# Patient Record
Sex: Male | Born: 1990 | Hispanic: No | Marital: Single | Smoking: Never smoker
Health system: Southern US, Community
[De-identification: ages and names within clinical notes are randomized; demographics above are authoritative.]

---

## 2012-10-31 ENCOUNTER — Emergency Department (HOSPITAL_COMMUNITY)
Admission: EM | Admit: 2012-10-31 | Discharge: 2012-10-31 | Disposition: A | Discharge status: Home / Self Care | Payer: Self-pay | Attending: Emergency Medicine | Admitting: Emergency Medicine

## 2012-10-31 ENCOUNTER — Encounter (HOSPITAL_COMMUNITY): Payer: Self-pay | Admitting: *Deleted

## 2012-10-31 ENCOUNTER — Emergency Department (HOSPITAL_COMMUNITY): Payer: Self-pay

## 2012-10-31 DIAGNOSIS — R509 Fever, unspecified: Secondary | ICD-10-CM | POA: Insufficient documentation

## 2012-10-31 DIAGNOSIS — J111 Influenza due to unidentified influenza virus with other respiratory manifestations: Secondary | ICD-10-CM | POA: Insufficient documentation

## 2012-10-31 LAB — URINALYSIS, ROUTINE W REFLEX MICROSCOPIC
Leukocytes, UA: NEGATIVE
Nitrite: NEGATIVE
Specific Gravity, Urine: 1.016 (ref 1.005–1.030)
pH: 7 (ref 5.0–8.0)

## 2012-10-31 LAB — BASIC METABOLIC PANEL
BUN: 13 mg/dL (ref 6–23)
Calcium: 9.8 mg/dL (ref 8.4–10.5)
GFR calc non Af Amer: 82 mL/min — ABNORMAL LOW (ref >90)
Glucose, Bld: 105 mg/dL — ABNORMAL HIGH (ref 70–99)
Sodium: 135 mEq/L (ref 135–145)

## 2012-10-31 LAB — CBC WITH DIFFERENTIAL/PLATELET
Basophils Relative: 1 % (ref 0–1)
Eosinophils Absolute: 0.1 10*3/uL (ref 0.0–0.7)
Hemoglobin: 17.2 g/dL — ABNORMAL HIGH (ref 13.0–17.0)
MCH: 31.3 pg (ref 26.0–34.0)
MCHC: 35.6 g/dL (ref 30.0–36.0)
Monocytes Absolute: 1.7 10*3/uL — ABNORMAL HIGH (ref 0.1–1.0)
Neutro Abs: 3.8 10*3/uL (ref 1.7–7.7)
Neutrophils Relative %: 44 % (ref 43–77)
RDW: 12.6 % (ref 11.5–15.5)

## 2012-10-31 LAB — GRAM STAIN: Gram Stain: NONE SEEN

## 2012-10-31 LAB — CSF CELL COUNT WITH DIFFERENTIAL

## 2012-10-31 LAB — RAPID URINE DRUG SCREEN, HOSP PERFORMED: Barbiturates: NOT DETECTED

## 2012-10-31 LAB — PROTEIN AND GLUCOSE, CSF: Total  Protein, CSF: 29 mg/dL (ref 15–45)

## 2012-10-31 MED ORDER — ACETAMINOPHEN 650 MG RE SUPP: 650.0000 mg | Freq: Once | RECTAL | Status: AC

## 2012-10-31 MED ORDER — OSELTAMIVIR PHOSPHATE 75 MG PO CAPS: 75.0000 mg | ORAL_CAPSULE | Freq: Two times a day (BID) | ORAL | Status: DC

## 2012-10-31 MED ORDER — ACETAMINOPHEN 650 MG RE SUPP
RECTAL | Status: AC
  Administered 2012-10-31: 01:00:00
  Filled 2012-10-31: qty 1

## 2012-10-31 MED ORDER — SODIUM CHLORIDE 0.9 % IV BOLUS (SEPSIS)
1000.0000 mL | Freq: Once | INTRAVENOUS | Status: AC
  Administered 2012-10-31: 1000 mL via INTRAVENOUS

## 2012-10-31 MED ORDER — ACETAMINOPHEN 500 MG PO TABS: 1000.0000 mg | ORAL_TABLET | Freq: Once | ORAL | Status: DC

## 2012-10-31 NOTE — ED Notes (Signed)
Father brought pt in via POV  States they were sitting at home on couch and that he said he couldn't breath good,  Pt has not been feeling well and father states that he took motrin and robitussin today, pt is hyperventilating,  Hands are drawn

## 2012-10-31 NOTE — ED Notes (Signed)
Pa informed about pt's vitals

## 2012-10-31 NOTE — ED Provider Notes (Signed)
History     CSN: [id number]  Arrival date & time 10/31/12  0015   First MD Initiated Contact with Patient 10/31/12 0043      Chief Complaint  Patient presents with  . Shortness of Breath   Level V caveat applies due to urgent need to intervene  HPI  History provided by the patient's father. Patient is a 21 year old male with no significant PMH who presents with fever, shortness of breath and altered mental status. Patient was having some cough and fever symptoms for the past 2 days and was not feeling well. He was given Robitussin last night but continued to have some symptoms. Early this morning patient continues to look ill and parents were concerned and brought patient for further evaluation. Patient has not had any recent travel. He has no specific known sick contacts. Patient does not believe he uses any drugs or alcohol.    No past medical history on file.  No past surgical history on file.  History reviewed. No pertinent family history.  History  Substance Use Topics  . Smoking status: Never Smoker   . Smokeless tobacco: Not on file  . Alcohol Use: No      Review of Systems  Unable to perform ROS: Other    Allergies  Review of patient's allergies indicates no known allergies.  Home Medications  No current outpatient prescriptions on file.  BP 133/60  Pulse 115  Temp 102.1 F (38.9 C) (Rectal)  Resp 20  SpO2 100%  Physical Exam  Nursing note and vitals reviewed. Constitutional: He appears well-developed and well-nourished.       Patient with active rigors.  HENT:  Head: Normocephalic and atraumatic.  Eyes: Conjunctivae normal and EOM are normal. Pupils are equal, round, and reactive to light.  Neck: Neck supple.       Pain with difficulty flexing neck  Cardiovascular: Regular rhythm.  Tachycardia present.   No murmur heard. Pulmonary/Chest: Effort normal and breath sounds normal. No respiratory distress. He has no wheezes. He has no rales.   Abdominal: Soft. There is no tenderness. There is no rebound and no guarding.  Musculoskeletal: He exhibits no edema and no tenderness.  Lymphadenopathy:    He has no cervical adenopathy.  Neurological: He is alert.       Follows commands but is not speaking.  Skin: Skin is warm.    ED Course  LUMBAR PUNCTURE Date/Time: 10/31/2012 2:00 AM Performed by: Angus Seller Authorized by: Angus Seller Consent: Verbal consent obtained. Risks and benefits: risks, benefits and alternatives were discussed Consent given by: parent Procedure consent: procedure consent matches procedure scheduled Site marked: the operative site was marked Patient identity confirmed: arm band Time out: Immediately prior to procedure a "time out" was called to verify the correct patient, procedure, equipment, support staff and site/side marked as required. Indications: evaluation for altered mental status and evaluation for infection Anesthesia: local infiltration Local anesthetic: lidocaine 2% with epinephrine Anesthetic total: 2 ml Patient sedated: no Preparation: Patient was prepped and draped in the usual sterile fashion. Lumbar space: L4-L5 interspace Patient's position: right lateral decubitus Needle gauge: 22 Needle type: spinal needle - Quincke tip Needle length: 2.5 in Number of attempts: 1 Opening pressure: 21 cm H2O Closing pressure: 17 cm H2O Fluid appearance: clear Tubes of fluid: 4 Total volume: 6 ml Post-procedure: site cleaned and adhesive bandage applied Patient tolerance: Patient tolerated the procedure well with no immediate complications.     CRITICAL CARE Performed  by: Gohan Collister S   Total critical care time: 40 minutes  Critical care time was exclusive of separately billable procedures and treating other patients.  Critical care was necessary to treat or prevent imminent or life-threatening deterioration.  Critical care was time spent personally by me on the  following activities: development of treatment plan with patient and/or surrogate as well as nursing, discussions with consultants, evaluation of patient's response to treatment, examination of patient, obtaining history from patient or surrogate, ordering and performing treatments and interventions, ordering and review of laboratory studies, ordering and review of radiographic studies, pulse oximetry and re-evaluation of patient's condition.  Multiple re\re evaluations of patient's. Lumbar puncture performed to rule out meningitis. Continued evaluation of laboratory testing. Close monitoring of temperature and vital signs.    Results for orders placed during the hospital encounter of 10/31/12  CBC WITH DIFFERENTIAL      Component Value Range   WBC 8.8  4.0 - 10.5 K/uL   RBC 5.50  4.22 - 5.81 MIL/uL   Hemoglobin 17.2 (*) 13.0 - 17.0 g/dL   HCT 40.9  81.1 - 91.4 %   MCV 87.8  78.0 - 100.0 fL   MCH 31.3  26.0 - 34.0 pg   MCHC 35.6  30.0 - 36.0 g/dL   RDW 78.2  95.6 - 21.3 %   Platelets 169  150 - 400 K/uL   Neutrophils Relative 44  43 - 77 %   Lymphocytes Relative 35  12 - 46 %   Monocytes Relative 19 (*) 3 - 12 %   Eosinophils Relative 1  0 - 5 %   Basophils Relative 1  0 - 1 %   Neutro Abs 3.8  1.7 - 7.7 K/uL   Lymphs Abs 3.1  0.7 - 4.0 K/uL   Monocytes Absolute 1.7 (*) 0.1 - 1.0 K/uL   Eosinophils Absolute 0.1  0.0 - 0.7 K/uL   Basophils Absolute 0.1  0.0 - 0.1 K/uL   WBC Morphology ATYPICAL LYMPHOCYTES    BASIC METABOLIC PANEL      Component Value Range   Sodium 135  135 - 145 mEq/L   Potassium 3.3 (*) 3.5 - 5.1 mEq/L   Chloride 98  96 - 112 mEq/L   CO2 19  19 - 32 mEq/L   Glucose, Bld 105 (*) 70 - 99 mg/dL   BUN 13  6 - 23 mg/dL   Creatinine, Ser 0.86  0.50 - 1.35 mg/dL   Calcium 9.8  8.4 - 57.8 mg/dL   GFR calc non Af Amer 82 (*) >90 mL/min   GFR calc Af Amer >90  >90 mL/min  URINE RAPID DRUG SCREEN (HOSP PERFORMED)      Component Value Range   Opiates NONE DETECTED   NONE DETECTED   Cocaine NONE DETECTED  NONE DETECTED   Benzodiazepines NONE DETECTED  NONE DETECTED   Amphetamines NONE DETECTED  NONE DETECTED   Tetrahydrocannabinol NONE DETECTED  NONE DETECTED   Barbiturates NONE DETECTED  NONE DETECTED  URINALYSIS, ROUTINE W REFLEX MICROSCOPIC      Component Value Range   Color, Urine YELLOW  YELLOW   APPearance CLOUDY (*) CLEAR   Specific Gravity, Urine 1.016  1.005 - 1.030   pH 7.0  5.0 - 8.0   Glucose, UA NEGATIVE  NEGATIVE mg/dL   Hgb urine dipstick NEGATIVE  NEGATIVE   Bilirubin Urine NEGATIVE  NEGATIVE   Ketones, ur 40 (*) NEGATIVE mg/dL   Protein, ur NEGATIVE  NEGATIVE  mg/dL   Urobilinogen, UA 1.0  0.0 - 1.0 mg/dL   Nitrite NEGATIVE  NEGATIVE   Leukocytes, UA NEGATIVE  NEGATIVE  CSF CELL COUNT WITH DIFFERENTIAL      Component Value Range   Tube # 1     Color, CSF COLORLESS  COLORLESS   Appearance, CSF CLEAR  CLEAR   Supernatant NOT INDICATED     RBC Count, CSF 11 (*) 0 /cu mm   WBC, CSF 0  0 - 5 /cu mm   Other Cells, CSF TOO FEW TO COUNT, SMEAR AVAILABLE FOR REVIEW    GRAM STAIN      Component Value Range   Specimen Description CSF     Special Requests NONE     Gram Stain       Value: NO ORGANISMS SEEN     NO WBC SEEN     Gram Stain Report Called to,Read Back By and Verified With: R HAYDEN AT 0334 ON 12.25.2013 BY NBROOKS   Report Status 10/31/2012 FINAL    PROTEIN AND GLUCOSE, CSF      Component Value Range   Glucose, CSF 60  43 - 76 mg/dL   Total  Protein, CSF 29  15 - 45 mg/dL  CSF CELL COUNT WITH DIFFERENTIAL      Component Value Range   Tube # 4     Color, CSF COLORLESS  COLORLESS   Appearance, CSF CLEAR  CLEAR   Supernatant NOT INDICATED     RBC Count, CSF 1 (*) 0 /cu mm   WBC, CSF 0  0 - 5 /cu mm   Other Cells, CSF TOO FEW TO COUNT, SMEAR AVAILABLE FOR REVIEW         Dg Chest 2 View  10/31/2012  *RADIOLOGY REPORT*  Clinical Data: Fever for 24 hours, shortness of breath, chills, chest pain, lethargy  CHEST -  2 VIEW  Comparison: None  Findings: Normal heart size, mediastinal contours, and pulmonary vascularity. Lungs clear. No pleural effusion or pneumothorax. Bones unremarkable.  IMPRESSION: No acute abnormalities.   Original Report Authenticated By: Ulyses Southward, M.D.    Ct Head Wo Contrast  10/31/2012  *RADIOLOGY REPORT*  Clinical Data: Altered mental status.  Fever.  CT HEAD WITHOUT CONTRAST  Technique:  Contiguous axial images were obtained from the base of the skull through the vertex without contrast.  Comparison: None.  Findings: There is no evidence of acute infarction, mass lesion, or intra- or extra-axial hemorrhage on CT.  The posterior fossa, including the cerebellum, brainstem and fourth ventricle, is within normal limits.  The third and lateral ventricles, and basal ganglia are unremarkable in appearance.  The cerebral hemispheres are symmetric in appearance, with normal gray- white differentiation.  No mass effect or midline shift is seen.  There is no evidence of fracture; visualized osseous structures are unremarkable in appearance.  The visualized portions of the orbits are within normal limits.  The paranasal sinuses and mastoid air cells are well-aerated.  No significant soft tissue abnormalities are seen.  IMPRESSION: Unremarkable noncontrast CT of the head.   Original Report Authenticated By: Tonia Ghent, M.D.      1. Influenza   2. Fever       MDM  12:40 AM patient seen and evaluated. Patient with shivers and altered mental status. He does follow commands.  Patient denies improvements after Tylenol PR. Heart rate has improved with fluids. Patient continues to be awake and alert still not talking much. Continues to shake  head for answers and follow commands.   patient now continuing to do much better. Heart rate is been normal. He is awake and alert. Speaking now.  Labs have continued to be unremarkable. No signs of pneumonia. No signs of UTI. LP essentially normal without  signs of infection. CT of head normal. Patient is now mentating normally. At this time suspect viral infection and possible influenza. Patient stable for discharge home will be given prescription for Tamiflu.     Date: 10/31/2012  Rate: 105  Rhythm: sinus tachycardia  QRS Axis: normal  Intervals: normal  ST/T Wave abnormalities: normal  Conduction Disutrbances:none  Narrative Interpretation:   Old EKG Reviewed: none available    Angus Seller, [place] 10/31/12 2027

## 2012-11-03 LAB — CSF CULTURE W GRAM STAIN: Culture: NO GROWTH

## 2012-11-06 NOTE — ED Provider Notes (Signed)
PT discussed with me  Medical screening examination/treatment/procedure(s) were performed by non-physician practitioner and as supervising physician I was immediately available for consultation/collaboration. Devoria Albe, MD, Armando Gang   Ward Givens, MD 11/06/12 [phone]

## 2014-04-26 IMAGING — CT CT HEAD W/O CM
2 series · 15 of 30 positions shown, 19 images · non-contrast
Comparison: None.

CLINICAL DATA: Altered mental status.  Fever.

CT HEAD WITHOUT CONTRAST
TECHNIQUE: Contiguous axial images were obtained from the base of
the skull through the vertex without contrast.

[Series 2: head w/o · axial · non-contrast · 0.44mm/px · z∈[-70,+60]mm · 13 of 32 slices shown, 17 images]
[im 3/32  brain]
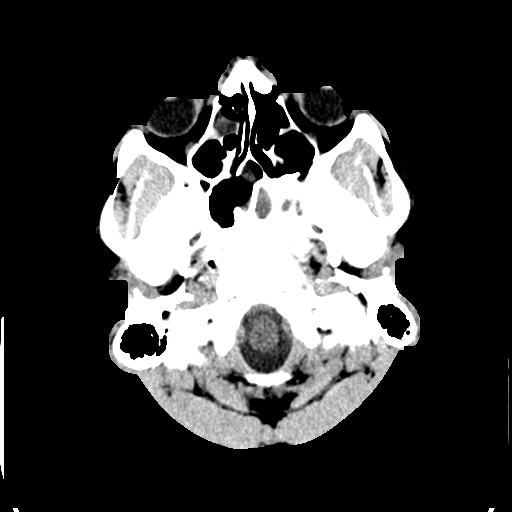
[im 3/32  bone]
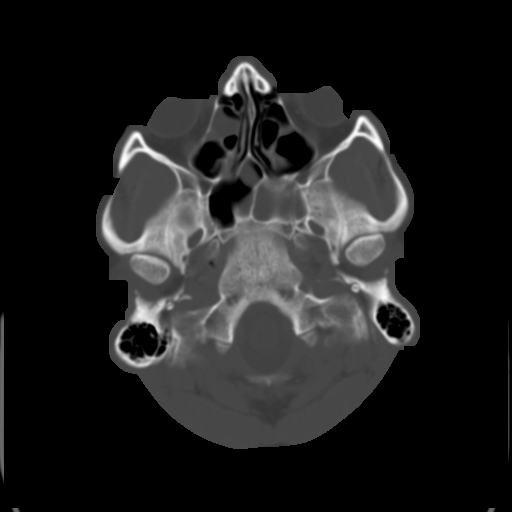
[im 5/32  brain]
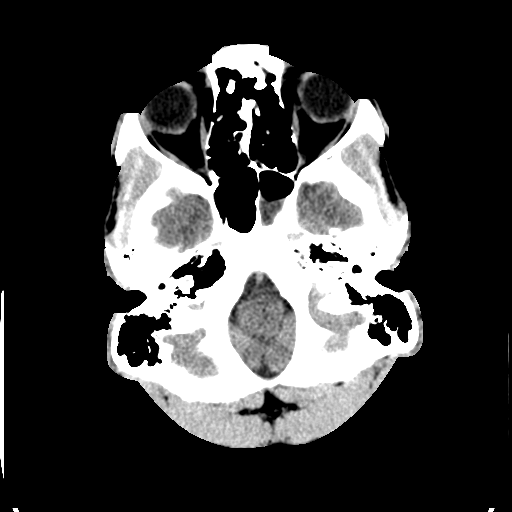
[im 7/32  brain]
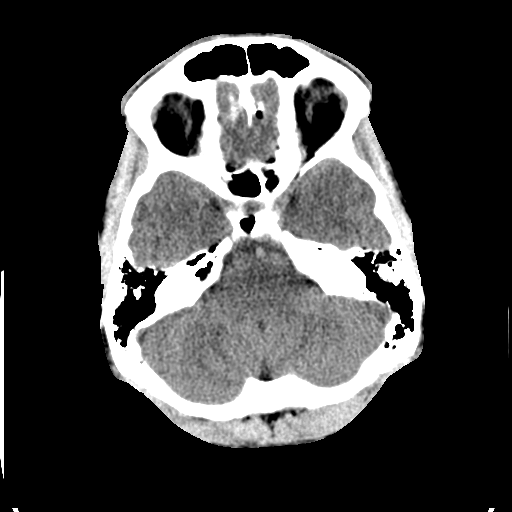
[im 9/32  brain]
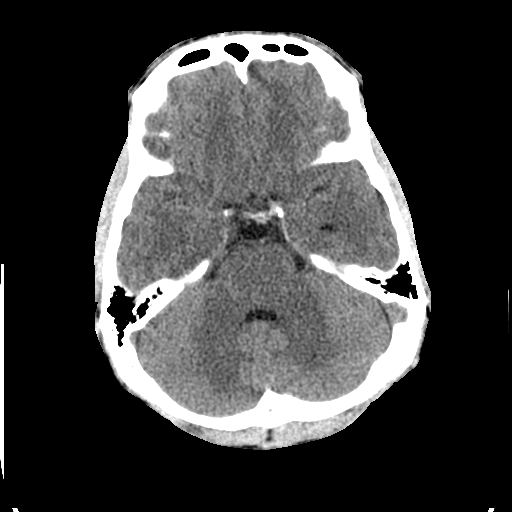
[im 12/32  brain]
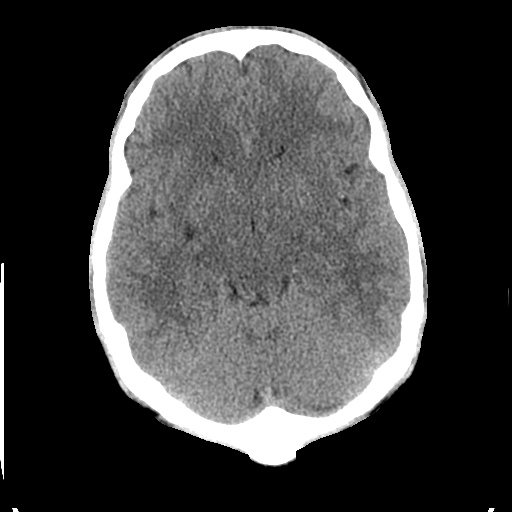
[im 12/32  bone]
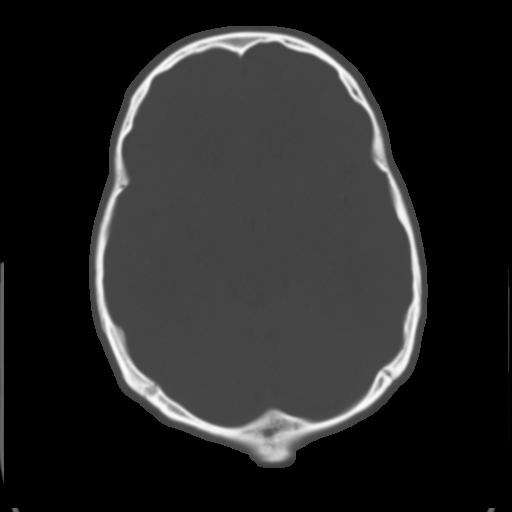
[im 14/32  brain]
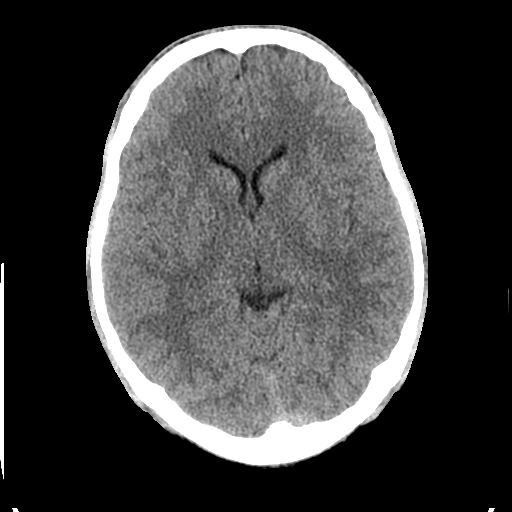
[im 16/32  brain]
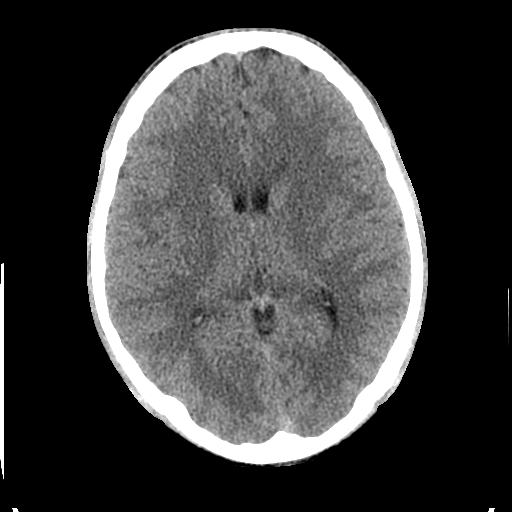
[im 18/32  brain]
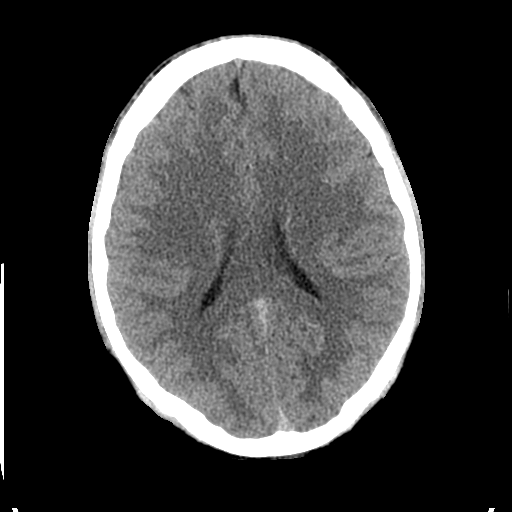
[im 20/32  brain]
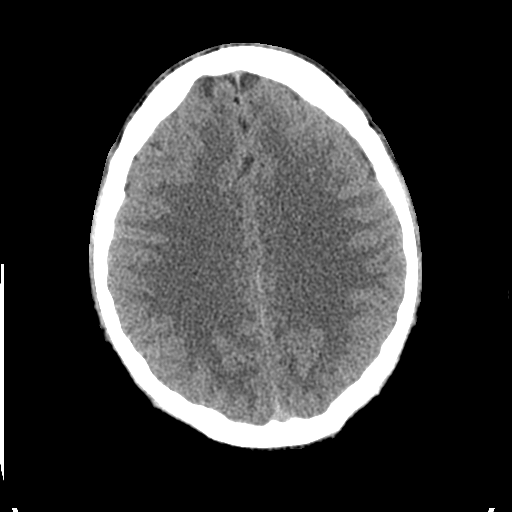
[im 20/32  bone]
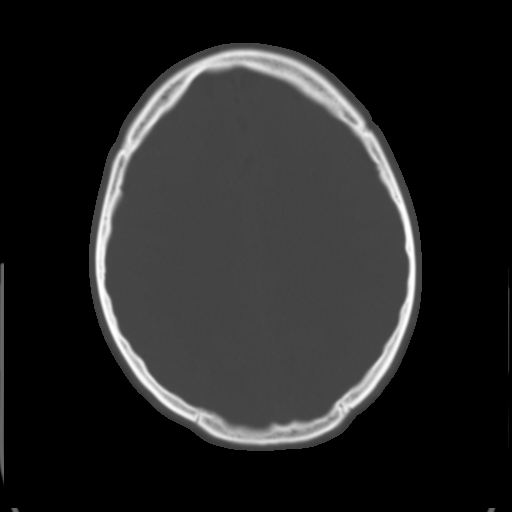
[im 23/32  brain]
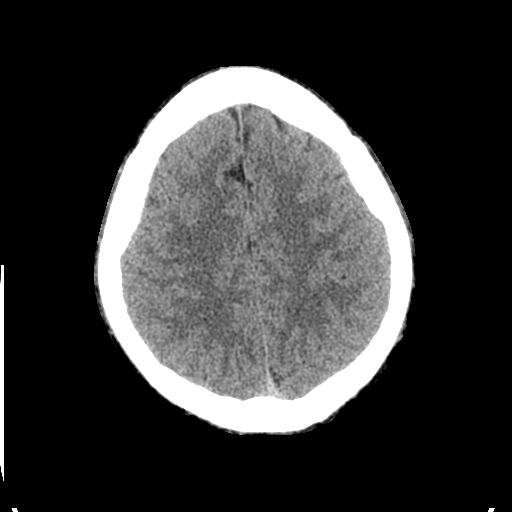
[im 25/32  brain]
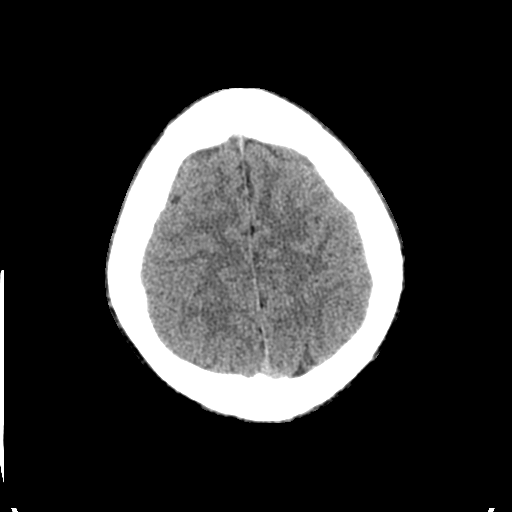
[im 27/32  brain]
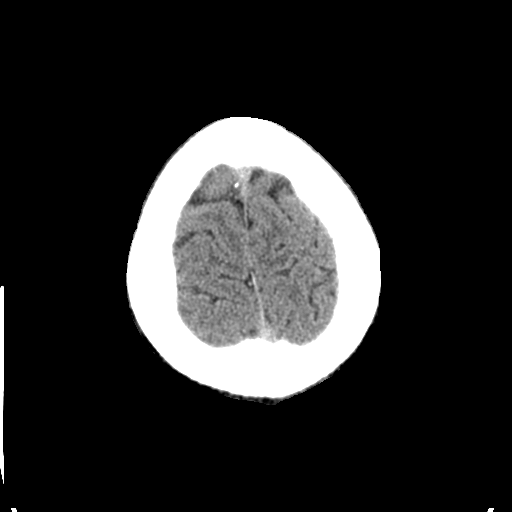
[im 29/32  brain]
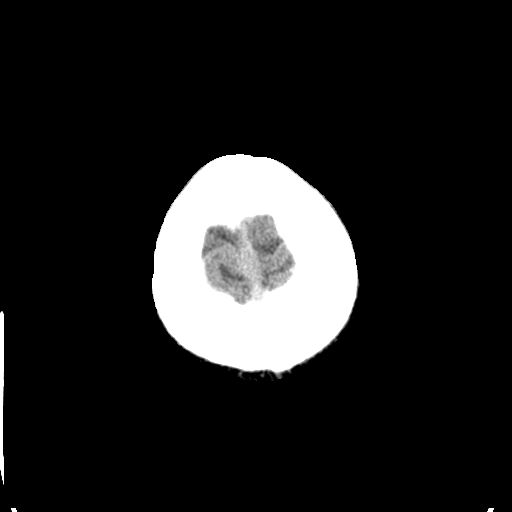
[im 29/32  bone]
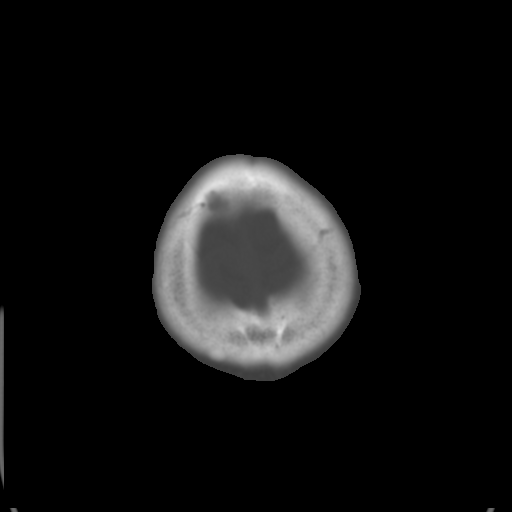

[Series 3: bone windows · axial · 0.44mm/px · z∈[-70,-50]mm · 2 of 32 slices shown]
[im 3/32  bone]
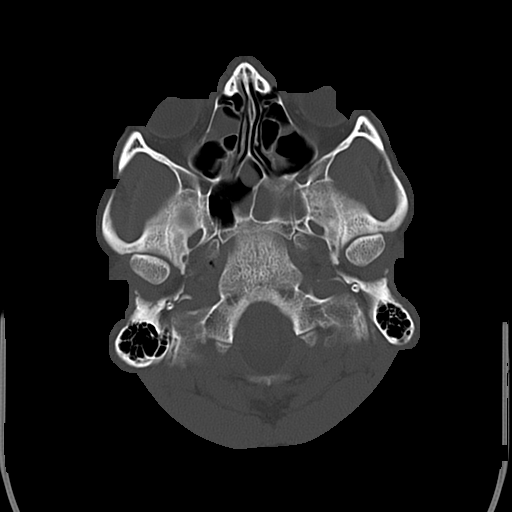
[im 7/32  bone]
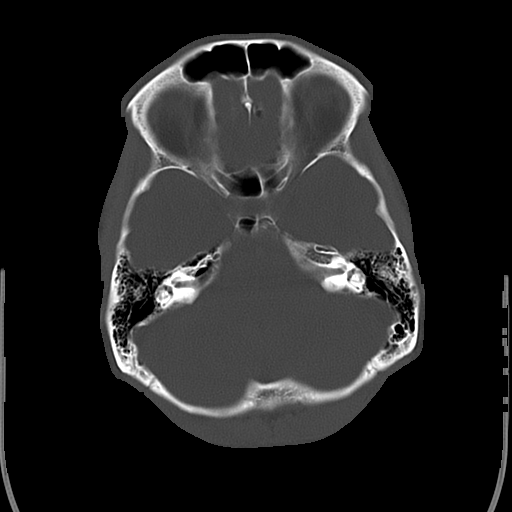

[15 of 30 positions shown; findings below may reference images not displayed]

FINDINGS: There is no evidence of acute infarction, mass lesion, or
intra- or extra-axial hemorrhage on CT.

The posterior fossa, including the cerebellum, brainstem and fourth
ventricle, is within normal limits.  The third and lateral
ventricles, and basal ganglia are unremarkable in appearance.  The
cerebral hemispheres are symmetric in appearance, with normal gray-
white differentiation.  No mass effect or midline shift is seen.

There is no evidence of fracture; visualized osseous structures are
unremarkable in appearance.  The visualized portions of the orbits
are within normal limits.  The paranasal sinuses and mastoid air
cells are well-aerated.  No significant soft tissue abnormalities
are seen.
IMPRESSION: Unremarkable noncontrast CT of the head.

## 2018-07-01 ENCOUNTER — Emergency Department (HOSPITAL_BASED_OUTPATIENT_CLINIC_OR_DEPARTMENT_OTHER)
Admission: EM | Admit: 2018-07-01 | Discharge: 2018-07-01 | Disposition: A | Discharge status: Home / Self Care | Payer: 59 | Attending: Emergency Medicine | Admitting: Emergency Medicine

## 2018-07-01 ENCOUNTER — Emergency Department (HOSPITAL_BASED_OUTPATIENT_CLINIC_OR_DEPARTMENT_OTHER): Payer: 59

## 2018-07-01 ENCOUNTER — Other Ambulatory Visit: Payer: Self-pay

## 2018-07-01 DIAGNOSIS — J45909 Unspecified asthma, uncomplicated: Secondary | ICD-10-CM | POA: Insufficient documentation

## 2018-07-01 DIAGNOSIS — J181 Lobar pneumonia, unspecified organism: Secondary | ICD-10-CM | POA: Diagnosis not present

## 2018-07-01 DIAGNOSIS — J4521 Mild intermittent asthma with (acute) exacerbation: Secondary | ICD-10-CM

## 2018-07-01 DIAGNOSIS — R0602 Shortness of breath: Secondary | ICD-10-CM | POA: Diagnosis present

## 2018-07-01 DIAGNOSIS — Z79899 Other long term (current) drug therapy: Secondary | ICD-10-CM | POA: Insufficient documentation

## 2018-07-01 DIAGNOSIS — J189 Pneumonia, unspecified organism: Secondary | ICD-10-CM

## 2018-07-01 MED ORDER — ALBUTEROL SULFATE (2.5 MG/3ML) 0.083% IN NEBU: 2.5000 mg | INHALATION_SOLUTION | Freq: Four times a day (QID) | RESPIRATORY_TRACT | 0 refills | Status: AC | PRN

## 2018-07-01 MED ORDER — PREDNISONE 50 MG PO TABS
60.0000 mg | ORAL_TABLET | Freq: Once | ORAL | Status: AC
  Administered 2018-07-01: 60 mg via ORAL
  Filled 2018-07-01: qty 1

## 2018-07-01 MED ORDER — BENZONATATE 100 MG PO CAPS: 100.0000 mg | ORAL_CAPSULE | Freq: Three times a day (TID) | ORAL | 0 refills | Status: AC | PRN

## 2018-07-01 MED ORDER — PREDNISONE 20 MG PO TABS: 60.0000 mg | ORAL_TABLET | Freq: Every day | ORAL | 0 refills | Status: AC

## 2018-07-01 MED ORDER — DOXYCYCLINE HYCLATE 100 MG PO TABS
100.0000 mg | ORAL_TABLET | Freq: Once | ORAL | Status: AC
  Administered 2018-07-01: 100 mg via ORAL
  Filled 2018-07-01: qty 1

## 2018-07-01 MED ORDER — ALBUTEROL SULFATE (2.5 MG/3ML) 0.083% IN NEBU
5.0000 mg | INHALATION_SOLUTION | Freq: Once | RESPIRATORY_TRACT | Status: AC
  Administered 2018-07-01: 5 mg via RESPIRATORY_TRACT
  Filled 2018-07-01: qty 6

## 2018-07-01 MED ORDER — BUDESONIDE-FORMOTEROL FUMARATE 160-4.5 MCG/ACT IN AERO: 2.0000 | INHALATION_SPRAY | Freq: Two times a day (BID) | RESPIRATORY_TRACT | 2 refills | Status: AC

## 2018-07-01 MED ORDER — DOXYCYCLINE HYCLATE 100 MG PO CAPS: 100.0000 mg | ORAL_CAPSULE | Freq: Two times a day (BID) | ORAL | 0 refills | Status: AC

## 2018-07-01 MED ORDER — ALBUTEROL SULFATE HFA 108 (90 BASE) MCG/ACT IN AERS
2.0000 | INHALATION_SPRAY | RESPIRATORY_TRACT | Status: DC | PRN
  Filled 2018-07-01: qty 6.7

## 2018-07-01 NOTE — ED Provider Notes (Signed)
[facility] EMERGENCY DEPARTMENT Provider Note   CSN: [id number] Arrival date & time: 07/01/18  0120     History   Chief Complaint Chief Complaint  Patient presents with  . Shortness of Breath    HPI Bobby Harrington is a 27 y.o. male.  HPI 27 year old male here with cough and shortness of breath.  Patient states he has an extensive previous history of asthma.  He has been seen by a pulmonologist and was on inhaled steroids and had significant relief.  However, he has been out of these for at least the last 6 months.  Over the last 2 months, he has had progressively worsening cough and shortness of breath.  He states he has began to feel short of breath even at rest.  He has had a dry cough that is now productive over the last several days.  He denies any fevers.  He has a mild, diffuse chest pain after coughing but no chest pain at rest.  He states his symptoms feel exactly similar to his previous asthma exacerbations.  He does note he has had some mild sore throat and sinus drainage over the last several days.  He has been using a rescue inhaler only intermittently, but has not recently been on antibiotics or steroids.  Denies any other complaints at this time. Sx worse with weather changes, moving, exertion.   No past medical history on file. Asthma  No relevant surgical Hx  Non-smoker  There are no active problems to display for this patient.    Home Medications    Prior to Admission medications   Medication Sig Start Date End Date Taking? Authorizing Provider  albuterol (PROVENTIL) (2.5 MG/3ML) 0.083% nebulizer solution Take 3 mLs (2.5 mg total) by nebulization every 6 (six) hours as needed for wheezing or shortness of breath. 07/01/18   Shaune Pollack, MD  benzonatate (TESSALON) 100 MG capsule Take 1 capsule (100 mg total) by mouth 3 (three) times daily as needed for cough. 07/01/18   Shaune Pollack, MD  budesonide-formoterol [facility]) 160-4.5 MCG/ACT  inhaler Inhale 2 puffs into the lungs 2 (two) times daily. 07/01/18   Shaune Pollack, MD  doxycycline (VIBRAMYCIN) 100 MG capsule Take 1 capsule (100 mg total) by mouth 2 (two) times daily for 10 days. 07/01/18 07/11/18  Shaune Pollack, MD  guaiFENesin (ROBITUSSIN) 100 MG/5ML SOLN Take 5 mLs by mouth every 4 (four) hours as needed. For cough    [provider]  ibuprofen (ADVIL,MOTRIN) 200 MG tablet Take 200-400 mg by mouth every 6 (six) hours as needed. For fever/pain    [provider]  oseltamivir (TAMIFLU) 75 MG capsule Take 1 capsule (75 mg total) by mouth every 12 (twelve) hours. 10/31/12   Ivonne Andrew, PA-C  predniSONE (DELTASONE) 20 MG tablet Take 3 tablets (60 mg total) by mouth daily for 5 days. 07/01/18 07/06/18  Shaune Pollack, MD    Family History No family history on file.  Social History Social History   Tobacco Use  . Smoking status: Never Smoker  Substance Use Topics  . Alcohol use: No  . Drug use: No     Allergies   Patient has no known allergies.   Review of Systems Review of Systems  Constitutional: Positive for chills and fatigue. Negative for fever.  HENT: Negative for congestion and rhinorrhea.   Eyes: Negative for visual disturbance.  Respiratory: Positive for cough, shortness of breath and wheezing.   Cardiovascular: Negative for chest pain and leg swelling.  Gastrointestinal: Negative for abdominal pain, diarrhea, nausea and vomiting.  Genitourinary: Negative for dysuria and flank pain.  Musculoskeletal: Negative for neck pain and neck stiffness.  Skin: Negative for rash and wound.  Allergic/Immunologic: Negative for immunocompromised state.  Neurological: Positive for weakness. Negative for syncope and headaches.  All other systems reviewed and are negative.    Physical Exam Updated Vital Signs BP (!) 143/107 (BP Location: Right Arm)   Pulse 96   Temp 98.4 F (36.9 C) (Oral)   Resp 20   Ht 6' (1.829 m)   Wt 97.5 kg   SpO2  98%   BMI 29.16 kg/m   Physical Exam  Constitutional: He is oriented to person, place, and time. He appears well-developed and well-nourished. No distress.  HENT:  Head: Normocephalic and atraumatic.  Mouth/Throat: Oropharynx is clear and moist.  Marked sinus congestion b/l, mild posterior pharyngeal erythema; no facial swelling  Eyes: Conjunctivae are normal.  Neck: Neck supple.  Cardiovascular: Normal rate, regular rhythm and normal heart sounds. Exam reveals no friction rub.  No murmur heard. Pulmonary/Chest: Effort normal. Tachypnea noted. No respiratory distress. He has wheezes. He has no rales.  Abdominal: He exhibits no distension.  Musculoskeletal: He exhibits no edema.  Neurological: He is alert and oriented to person, place, and time. He exhibits normal muscle tone.  Skin: Skin is warm. Capillary refill takes less than 2 seconds.  Psychiatric: He has a normal mood and affect.  Nursing note and vitals reviewed.    ED Treatments / Results  Labs (all labs ordered are listed, but only abnormal results are displayed) Labs Reviewed - No data to display  EKG None  Radiology Dg Chest 2 View  Result Date: 07/01/2018 CLINICAL DATA:  27 y/o M; worsening respiratory difficulty with fever, wheezing, chest tightness. EXAM: CHEST - 2 VIEW COMPARISON:  None. FINDINGS: Stable normal cardiac silhouette given projection and technique. No pleural effusion or pneumothorax. Bones are unremarkable. Small superior left lower lobe opacity best visualized on the lateral view. IMPRESSION: Small superior left lower lobe opacity best visualized on the lateral view may represent pneumonia. Electronically Signed   By: Mitzi Hansen M.D.   On: 07/01/2018 02:59    Procedures Procedures (including critical care time)  Medications Ordered in ED Medications  albuterol (PROVENTIL HFA;VENTOLIN HFA) 108 (90 Base) MCG/ACT inhaler 2 puff (has no administration in time range)  albuterol  (PROVENTIL) (2.5 MG/3ML) 0.083% nebulizer solution 5 mg (5 mg Nebulization Given 07/01/18 0200)  predniSONE (DELTASONE) tablet 60 mg (60 mg Oral Given 07/01/18 0230)  albuterol (PROVENTIL) (2.5 MG/3ML) 0.083% nebulizer solution 5 mg (5 mg Nebulization Given 07/01/18 0311)  doxycycline (VIBRA-TABS) tablet 100 mg (100 mg Oral Given 07/01/18 0335)     Initial Impression / Assessment and Plan / ED Course  I have reviewed the triage vital signs and the nursing notes.  Pertinent labs & imaging results that were available during my care of the patient were reviewed by me and considered in my medical decision making (see chart for details).     27 year old male here with wheezing and cough with sputum production.  Patient is afebrile and in no distress on arrival, though is mildly tachypneic.  Clinically, concern for acute on chronic asthma exacerbation with likely URI.  His chest x-ray does show a possible small left lower lobe opacity concerning for pneumonia.  He is afebrile, with no signs of sepsis clinically.  He was given a breathing treatment and prednisone with excellent effect and  he is amatory throughout the ED without difficulty.  Given his otherwise well appearance, normal oxygen sats, normal work of breathing after a single neb, and well appearance, will elect to treat as outpatient with doxycycline, prednisone, and albuterol.  Of note, patient had previously been on Breo and has been unable to afford this.  Will replace with Symbicort.  I have advised him to follow-up with his pulmonologist and reiterated the need of a asthma action plan and good follow-up.  Final Clinical Impressions(s) / ED Diagnoses   Final diagnoses:  Community acquired pneumonia of left upper lobe of lung (HCC)  Mild intermittent asthma with exacerbation    ED Discharge Orders         Ordered    predniSONE (DELTASONE) 20 MG tablet  Daily     07/01/18 0413    budesonide-formoterol (SYMBICORT) 160-4.5 MCG/ACT inhaler   2 times daily     07/01/18 0413    doxycycline (VIBRAMYCIN) 100 MG capsule  2 times daily     07/01/18 0413    benzonatate (TESSALON) 100 MG capsule  3 times daily PRN     07/01/18 0413    albuterol (PROVENTIL) (2.5 MG/3ML) 0.083% nebulizer solution  Every 6 hours PRN     07/01/18 0441           Shaune PollackIsaacs, Levia Waltermire, MD 07/01/18 0505

## 2018-07-01 NOTE — Progress Notes (Signed)
Patient ambulated around the department while on pulse ox.  Patient's SPO2 remained between 96 and 97%.  Patient's HR initially was in the mid 130s but then dropped to 115 to 120.

## 2018-07-01 NOTE — Progress Notes (Signed)
RN gave patient albuterol nebulizer treatment.

## 2018-07-01 NOTE — ED Triage Notes (Signed)
Pt reports progressive sob, cp, cough, and congestion. Pt denies hx of asthma. Pt A+OX4, NAD.

## 2018-07-01 NOTE — ED Notes (Signed)
Patient transported to X-ray 

## 2018-07-01 NOTE — ED Notes (Signed)
Pt states he is out of his albuterol for his nebulizer. Will ask MD for a RX for pt

## 2018-07-01 NOTE — ED Notes (Signed)
Breathing tx and provider @bedside 

## 2019-12-25 IMAGING — DX DG CHEST 2V
2 series · 2 of 2 positions shown · non-contrast
Comparison: None.

CLINICAL DATA: 27 y/o M; worsening respiratory difficulty with
fever, wheezing, chest tightness.

EXAM:
CHEST - 2 VIEW

[chest pa]
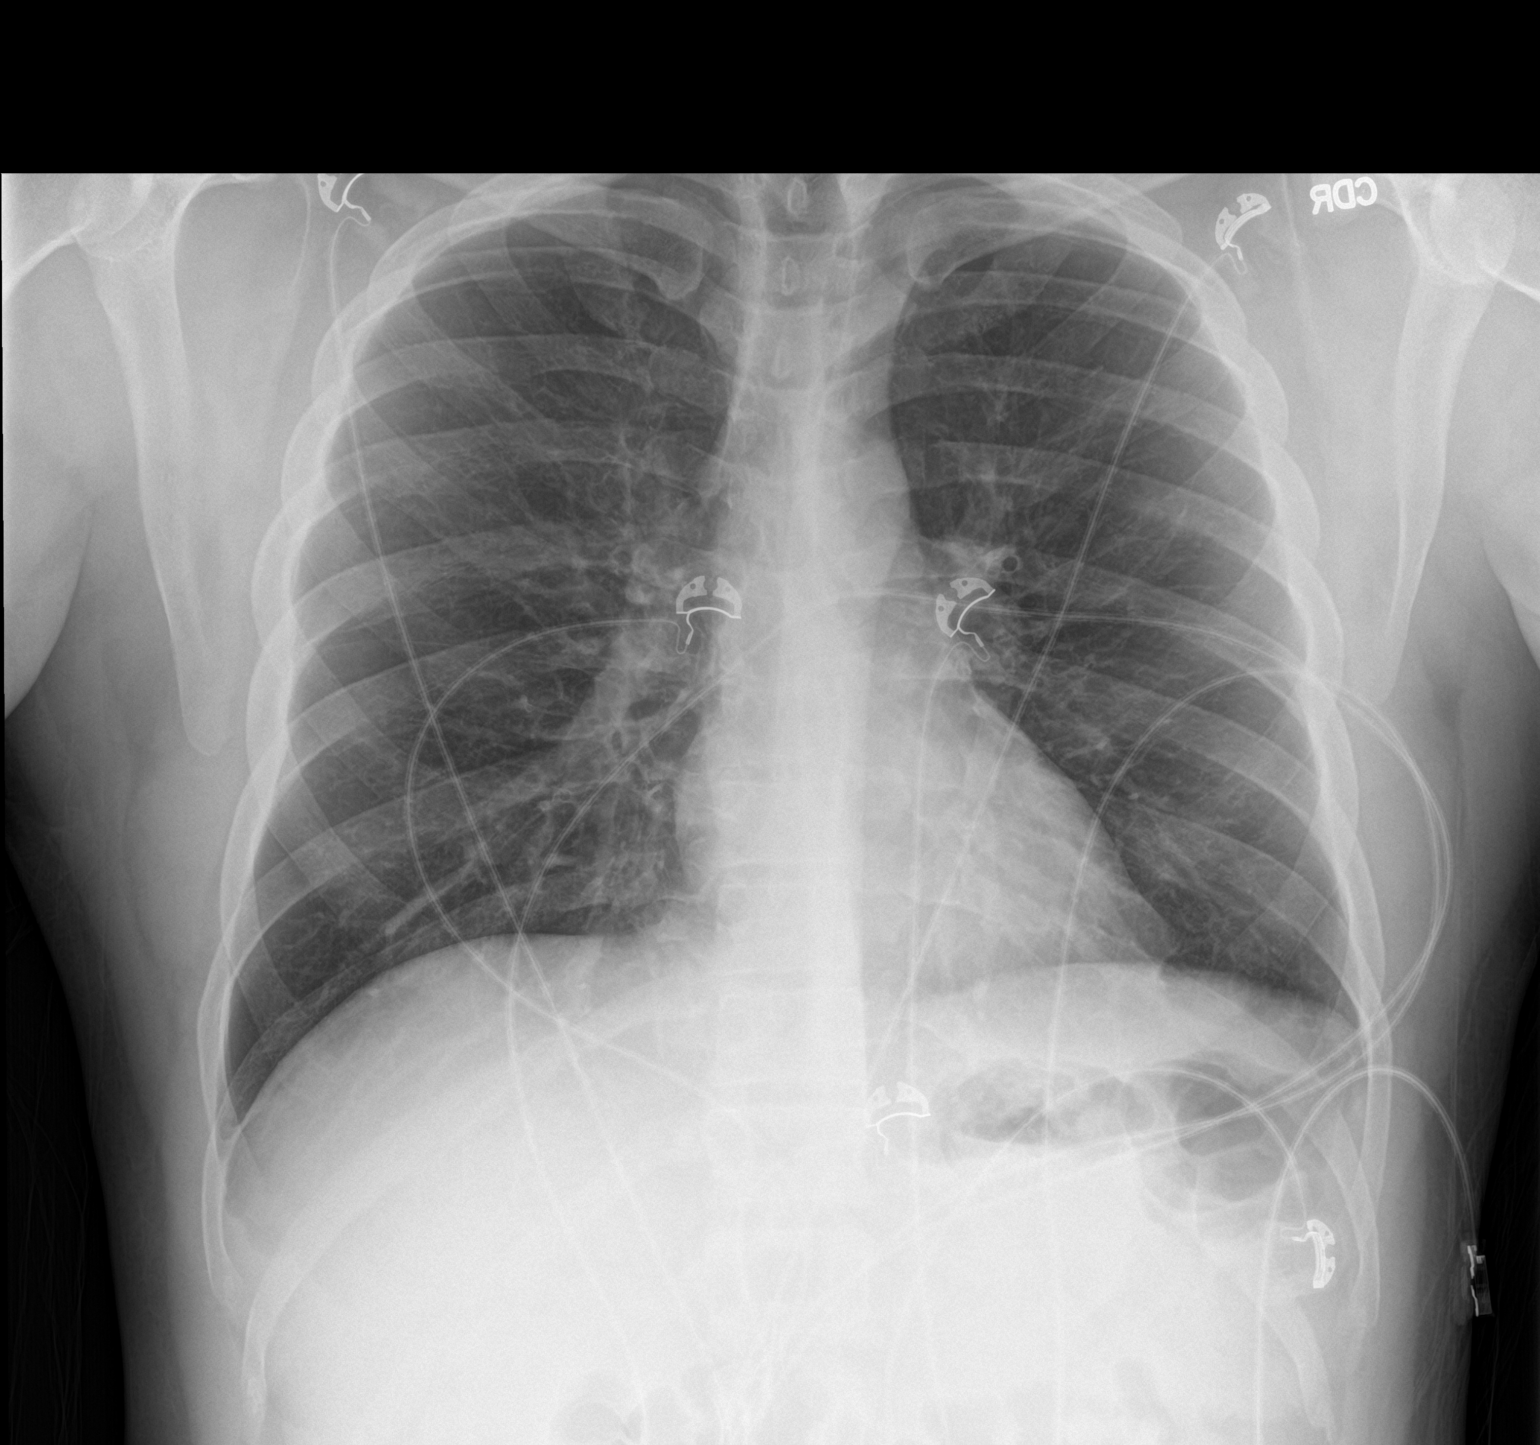

[chest lat]
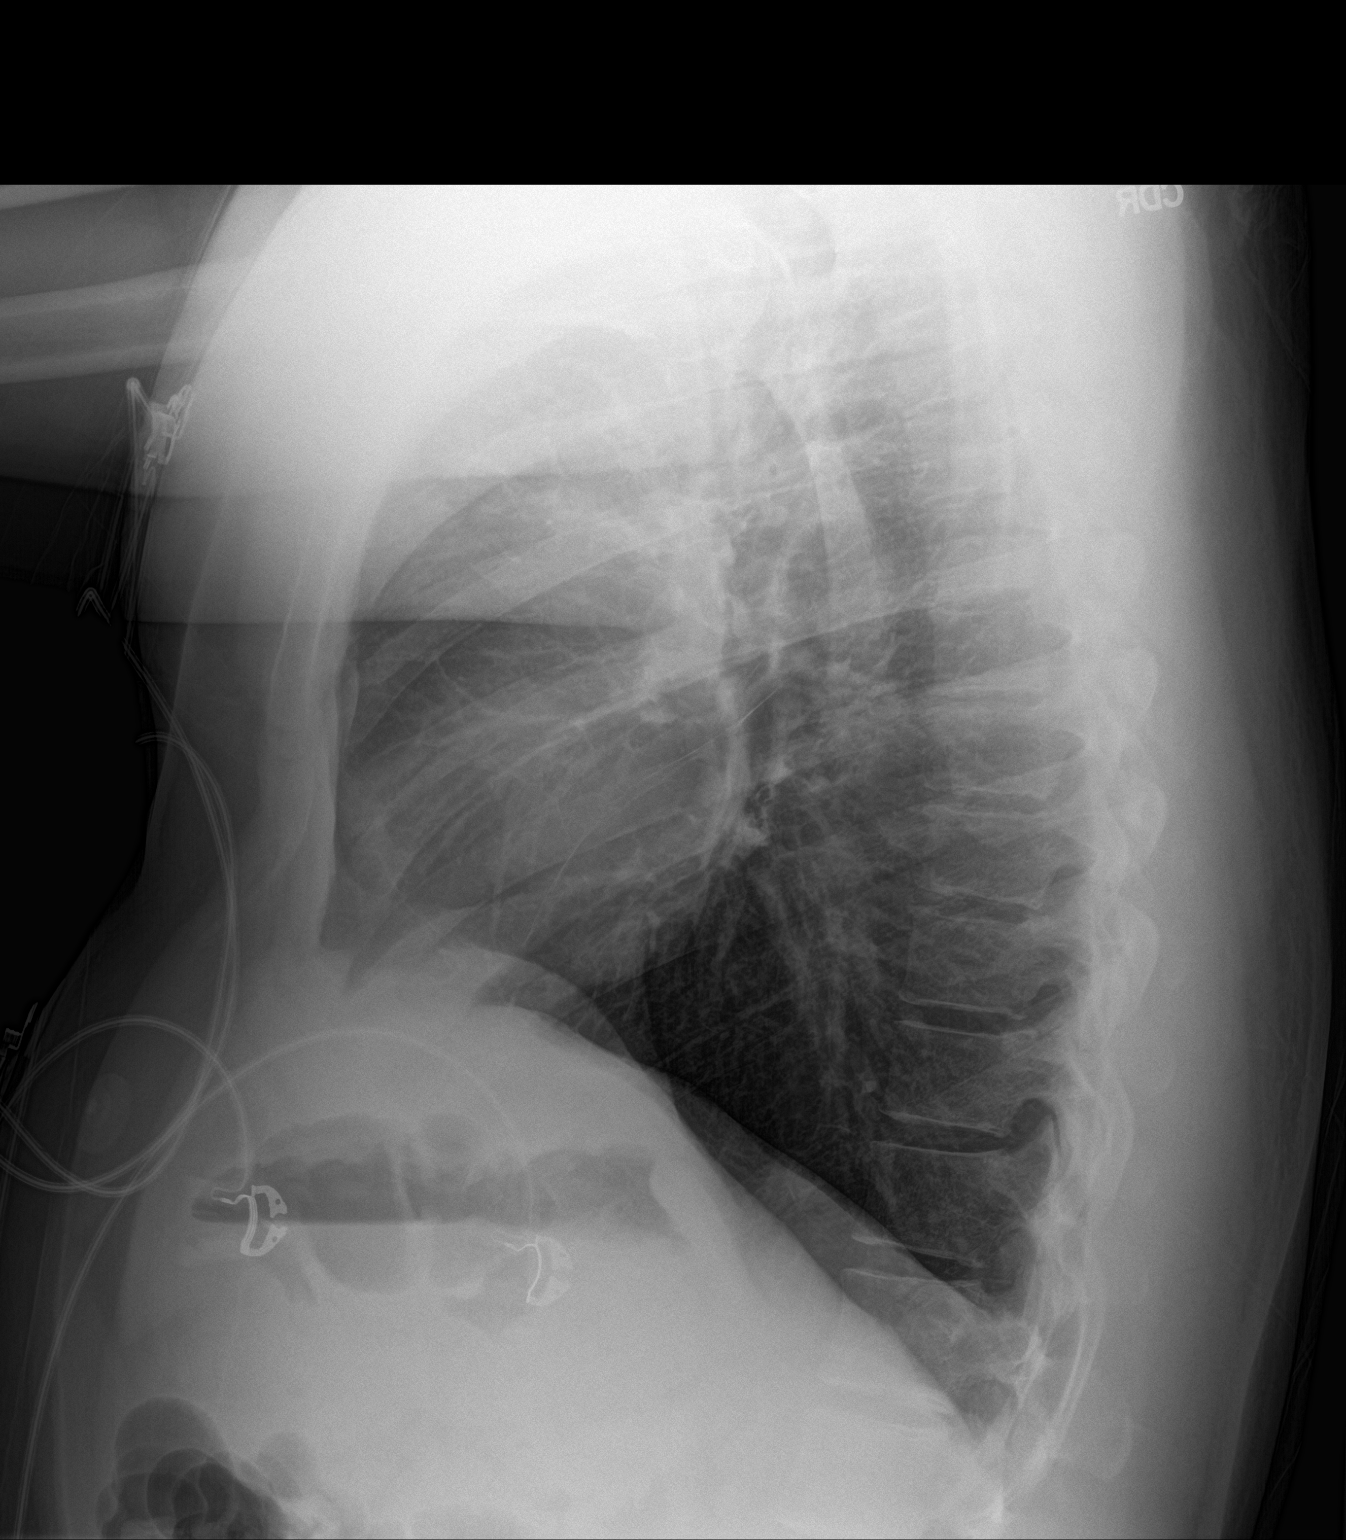

[2 of 2 positions shown; findings below may reference images not displayed]

FINDINGS: Stable normal cardiac silhouette given projection and technique. No
pleural effusion or pneumothorax. Bones are unremarkable. Small
superior left lower lobe opacity best visualized on the lateral
view.
IMPRESSION: Small superior left lower lobe opacity best visualized on the
lateral view may represent pneumonia.

By: Heiraandt Seyl M.D.

## 2024-10-30 ENCOUNTER — Emergency Department (HOSPITAL_COMMUNITY)
Admission: EM | Admit: 2024-10-30 | Discharge: 2024-10-31 | Disposition: A | Discharge status: Home / Self Care | Payer: PRIVATE HEALTH INSURANCE | Attending: Emergency Medicine | Admitting: Emergency Medicine

## 2024-10-30 ENCOUNTER — Emergency Department (HOSPITAL_COMMUNITY): Payer: PRIVATE HEALTH INSURANCE

## 2024-10-30 DIAGNOSIS — R059 Cough, unspecified: Secondary | ICD-10-CM | POA: Diagnosis present

## 2024-10-30 DIAGNOSIS — Z7951 Long term (current) use of inhaled steroids: Secondary | ICD-10-CM | POA: Diagnosis not present

## 2024-10-30 DIAGNOSIS — J101 Influenza due to other identified influenza virus with other respiratory manifestations: Secondary | ICD-10-CM | POA: Insufficient documentation

## 2024-10-30 DIAGNOSIS — R000 Tachycardia, unspecified: Secondary | ICD-10-CM | POA: Diagnosis not present

## 2024-10-30 DIAGNOSIS — J45909 Unspecified asthma, uncomplicated: Secondary | ICD-10-CM | POA: Insufficient documentation

## 2024-10-30 LAB — RESP PANEL BY RT-PCR (RSV, FLU A&B, COVID)  RVPGX2
Influenza A by PCR: POSITIVE — AB
Influenza B by PCR: NEGATIVE
Resp Syncytial Virus by PCR: NEGATIVE
SARS Coronavirus 2 by RT PCR: NEGATIVE

## 2024-10-30 MED ORDER — LACTATED RINGERS IV BOLUS
1000.0000 mL | Freq: Once | INTRAVENOUS | Status: AC
  Administered 2024-10-30: 1000 mL via INTRAVENOUS

## 2024-10-30 MED ORDER — ACETAMINOPHEN 500 MG PO TABS
1000.0000 mg | ORAL_TABLET | Freq: Once | ORAL | Status: AC
  Administered 2024-10-30: 1000 mg via ORAL
  Filled 2024-10-30: qty 2

## 2024-10-30 MED ORDER — KETOROLAC TROMETHAMINE 15 MG/ML IJ SOLN
15.0000 mg | Freq: Once | INTRAMUSCULAR | Status: AC
  Administered 2024-10-31: 15 mg via INTRAVENOUS
  Filled 2024-10-30: qty 1

## 2024-10-30 NOTE — Discharge Instructions (Addendum)
 Your symptoms should resolve in the next 5 to 7 days.  You need to drink lots of fluids, rest and take Tylenol  or ibuprofen for fever.

## 2024-10-30 NOTE — ED Triage Notes (Signed)
 Pt POV d/t flu like s/s for 1.5 weeks.  Pt was seen and taking Azithromycin but states he has a cough, body aches, and chills.

## 2024-10-30 NOTE — ED Provider Notes (Signed)
 " [name] [facility] AT [facility] Provider Note   CSN: [id number] Arrival date & time: 10/30/24  2205     Patient presents with: Cough   Bobby Harrington is a 33 y.o. male.   Pt is a 33y/o male with hx of nasal polyps and asthma on dupixant who is presenting today with 2 days of headache, fever/chills, malaise, myalgias with anorexia.  Pt states Bobby Harrington was off dupixant for a few months due to insurance but Bobby Harrington is now back on it and when off Bobby Harrington would have off and on nasal congestion and cough but the sx Bobby Harrington has now are much different in the last 2 days.  Pt has no abd pain or chest pain.  Cough is dry.  Bobby Harrington denies ongoing SOB.  No wheezes or need for inhaler for over a year.  No recent travel overseas.  The history is provided by the patient.  Cough      Prior to Admission medications  Medication Sig Start Date End Date Taking? Authorizing Provider  albuterol  (PROVENTIL ) (2.5 MG/3ML) 0.083% nebulizer solution Take 3 mLs (2.5 mg total) by nebulization every 6 (six) hours as needed for wheezing or shortness of breath. 07/01/18   Angelena Smalls, MD  benzonatate  (TESSALON ) 100 MG capsule Take 1 capsule (100 mg total) by mouth 3 (three) times daily as needed for cough. 07/01/18   Angelena Smalls, MD  budesonide -formoterol  (SYMBICORT ) 160-4.5 MCG/ACT inhaler Inhale 2 puffs into the lungs 2 (two) times daily. 07/01/18   Angelena Smalls, MD  guaiFENesin (ROBITUSSIN) 100 MG/5ML SOLN Take 5 mLs by mouth every 4 (four) hours as needed. For cough    [provider]  ibuprofen (ADVIL,MOTRIN) 200 MG tablet Take 200-400 mg by mouth every 6 (six) hours as needed. For fever/pain    [provider]  oseltamivir  (TAMIFLU ) 75 MG capsule Take 1 capsule (75 mg total) by mouth every 12 (twelve) hours. 10/31/12   Dammen, Peter, PA-C    Allergies: Patient has no known allergies.    Review of Systems  Respiratory:  Positive for cough.     Updated Vital Signs BP (!)  182/125 (BP Location: Right Arm)   Pulse (!) 148   Temp 98.9 F (37.2 C) (Oral)   Resp (!) 22   SpO2 97%   Physical Exam Vitals and nursing note reviewed.  Constitutional:      General: Bobby Harrington is not in acute distress.    Appearance: Bobby Harrington is well-developed.  HENT:     Head: Normocephalic and atraumatic.     Right Ear: Tympanic membrane normal.     Left Ear: Tympanic membrane normal.     Nose: Congestion present.  Eyes:     Conjunctiva/sclera: Conjunctivae normal.     Pupils: Pupils are equal, round, and reactive to light.  Cardiovascular:     Rate and Rhythm: Regular rhythm. Tachycardia present.     Heart sounds: No murmur heard. Pulmonary:     Effort: Pulmonary effort is normal. No respiratory distress.     Breath sounds: Normal breath sounds. No wheezing or rales.  Musculoskeletal:        General: No tenderness. Normal range of motion.     Cervical back: Normal range of motion and neck supple.  Skin:    General: Skin is warm and dry.     Findings: No erythema or rash.  Neurological:     Mental Status: Bobby Harrington is alert and oriented to person, place, and time.  Psychiatric:        Behavior: Behavior normal.     (all labs ordered are listed, but only abnormal results are displayed) Labs Reviewed  RESP PANEL BY RT-PCR (RSV, FLU A&B, COVID)  RVPGX2 - Abnormal; Notable for the following components:      Result Value   Influenza A by PCR POSITIVE (*)    All other components within normal limits    EKG: EKG Interpretation Date/Time:  Wednesday October 30 2024 22:45:37 EST Ventricular Rate:  141 PR Interval:  135 QRS Duration:  69 QT Interval:  285 QTC Calculation: 437 R Axis:   35  Text Interpretation: Sinus tachycardia Borderline T wave abnormalities Confirmed by Doretha Folks ([id number]) on 10/30/2024 10:54:00 PM  Radiology: [facility] Chest 2 View Result Date: 10/30/2024 CLINICAL DATA:  Cough. EXAM: CHEST - 2 VIEW COMPARISON:  07/01/2018 FINDINGS: The cardiomediastinal  contours are normal. The lungs are clear. Pulmonary vasculature is normal. No consolidation, pleural effusion, or pneumothorax. No acute osseous abnormalities are seen. IMPRESSION: No active cardiopulmonary disease. Electronically Signed   By: Andrea Gasman M.D.   On: 10/30/2024 22:35     Procedures   Medications Ordered in the ED  ketorolac  (TORADOL ) 15 MG/ML injection 15 mg (has no administration in time range)  acetaminophen  (TYLENOL ) tablet 1,000 mg (1,000 mg Oral Given 10/30/24 2224)  lactated ringers  bolus 1,000 mL (1,000 mLs Intravenous New Bag/Given 10/30/24 2301)    Clinical Course as of 10/30/24 2330  Wed Oct 30, 2024  2326 Flu Postive getting treated. Reassess.  [CC]    Clinical Course User Index [CC] Jerral Meth, MD                                 Medical Decision Making Amount and/or Complexity of Data Reviewed Radiology: ordered and independent interpretation performed. Decision-making details documented in ED Course. ECG/medicine tests: ordered and independent interpretation performed. Decision-making details documented in ED Course.  Risk OTC drugs. Prescription drug management.   Pt with symptoms consistent with viral URI/flu.  Well appearing here but febrile and tachycardic.  Pt is not having any wheezing or signs of breathing difficulty  No signs of pharyngitis, otitis or abnormal abdominal findings.  Pt does take dupixant.  Bobby Harrington was seen by PCP yesterday and started on azithromycin but no improvement and suspect viral etiology.  No travel or concern for malaria or TB.  Will r/o PnA and will get viral swab.  Pt given fever control. I have independently visualized and interpreted pt's images today. CXR wnl and flu test is positive.  I independently interpreted patient's EKG which shows a normal sinus tachycardia.  Patient given fluids and Tylenol .  Pt to return with any further problems.      Final diagnoses:  Influenza A    ED Discharge Orders      None          Doretha Folks, MD 10/30/24 2330  "

## 2024-10-31 ENCOUNTER — Emergency Department (HOSPITAL_COMMUNITY): Payer: PRIVATE HEALTH INSURANCE

## 2024-10-31 LAB — CBC WITH DIFFERENTIAL/PLATELET
Abs Immature Granulocytes: 0.04 K/uL (ref 0.00–0.07)
Basophils Absolute: 0.1 K/uL (ref 0.0–0.1)
Basophils Relative: 1 %
Eosinophils Absolute: 0.4 K/uL (ref 0.0–0.5)
Eosinophils Relative: 6 %
HCT: 48.5 % (ref 39.0–52.0)
Hemoglobin: 16.2 g/dL (ref 13.0–17.0)
Immature Granulocytes: 1 %
Lymphocytes Relative: 12 %
Lymphs Abs: 0.8 K/uL (ref 0.7–4.0)
MCH: 29.4 pg (ref 26.0–34.0)
MCHC: 33.4 g/dL (ref 30.0–36.0)
MCV: 88 fL (ref 80.0–100.0)
Monocytes Absolute: 1 K/uL (ref 0.1–1.0)
Monocytes Relative: 14 %
Neutro Abs: 4.8 K/uL (ref 1.7–7.7)
Neutrophils Relative %: 66 %
Platelets: 219 K/uL (ref 150–400)
RBC: 5.51 MIL/uL (ref 4.22–5.81)
RDW: 12.5 % (ref 11.5–15.5)
WBC: 7.2 K/uL (ref 4.0–10.5)
nRBC: 0 % (ref 0.0–0.2)

## 2024-10-31 LAB — BASIC METABOLIC PANEL WITH GFR
Anion gap: 12 (ref 5–15)
BUN: 9 mg/dL (ref 6–20)
CO2: 24 mmol/L (ref 22–32)
Calcium: 8.5 mg/dL — ABNORMAL LOW (ref 8.9–10.3)
Chloride: 105 mmol/L (ref 98–111)
Creatinine, Ser: 1.26 mg/dL — ABNORMAL HIGH (ref 0.61–1.24)
GFR, Estimated: >60 mL/min
Glucose, Bld: 98 mg/dL (ref 70–99)
Potassium: 3.4 mmol/L — ABNORMAL LOW (ref 3.5–5.1)
Sodium: 141 mmol/L (ref 135–145)

## 2024-10-31 MED ORDER — ONDANSETRON HCL 4 MG PO TABS: 4.0000 mg | ORAL_TABLET | Freq: Four times a day (QID) | ORAL | 0 refills | Status: AC

## 2024-10-31 MED ORDER — CELECOXIB 200 MG PO CAPS: 200.0000 mg | ORAL_CAPSULE | Freq: Two times a day (BID) | ORAL | 0 refills | Status: AC

## 2024-10-31 MED ORDER — OXYCODONE-ACETAMINOPHEN 5-325 MG PO TABS: 1.0000 | ORAL_TABLET | Freq: Four times a day (QID) | ORAL | 0 refills | Status: AC | PRN

## 2024-10-31 MED ORDER — LACTATED RINGERS IV BOLUS
1000.0000 mL | Freq: Once | INTRAVENOUS | Status: AC
  Administered 2024-10-31: 1000 mL via INTRAVENOUS

## 2024-10-31 MED ORDER — IOHEXOL 350 MG/ML SOLN
75.0000 mL | Freq: Once | INTRAVENOUS | Status: AC | PRN
  Administered 2024-10-31: 75 mL via INTRAVENOUS

## 2024-10-31 MED ORDER — OSELTAMIVIR PHOSPHATE 75 MG PO CAPS: 75.0000 mg | ORAL_CAPSULE | Freq: Two times a day (BID) | ORAL | 0 refills | Status: AC

## 2024-10-31 MED ORDER — MORPHINE SULFATE (PF) 4 MG/ML IV SOLN
4.0000 mg | Freq: Once | INTRAVENOUS | Status: AC
  Administered 2024-10-31: 4 mg via INTRAVENOUS
  Filled 2024-10-31: qty 1

## 2024-10-31 NOTE — ED Provider Notes (Signed)
 Care of patient received from prior provider at 1:55 AM, please see their note for complete H/P and care plan.  Received handoff per ED course.  Clinical Course as of 10/31/24 0155  Wed Oct 30, 2024  2326 Flu Postive getting treated. Reassess.  [CC]    Clinical Course User Index [CC] Jerral Meth, MD    Reassessment: Ultimately, patient tested positive for flu. I observed him for 7 hours in the emergency room.  Goal was for observation of his tachycardia with hopeful improvement after treatment of his fever.  However his tachycardia only transiently improved and his fever never returned.  His heart rate dropped to a nadir of 115 before returning to 130 at rest.  He stated he is always tachycardic but this seems more severe than I would anticipate for just resting tachycardia and an otherwise healthy 33 year old male. He developed a headache that was intermittent and got better with medications. Had a long conversation with patient at bedside.  While this is likely all the effect of the flu, myocarditis, encephalitis, meningitis would all remain on my differential and I recommended that he stay in the hospital for observation as this is my typical care plan for any tachycardia that does not resolve. Patient stated that he does not want to be admitted to the hospital and that even though his heart rate is up he feels nearly completely improved. We discussed risk of progression of disease in an unmonitored setting including cardiac demise, or for rapid progression of illness and he expressed understanding of these risks but ultimately states that he feels better and that he can manage his flulike symptoms in the outpatient setting. Given his understanding of risk and direct discussion of return precautions, will participate in shared medical decision making.  Disposition:  Patient is requesting discharge at this time.  Given patient's understanding of risk of severe missed diagnosis based  on limitations of today's evaluation and risk of interval worsening of disease including life or limb threatening pathology, will participate in shared medical decision making and patient directed discharge at this time.  Patient is welcome to return for further diagnostic evaluation/therapeutic management at any time.      Jerral Meth, MD 10/31/24 [phone]
